# Patient Record
Sex: Male | Born: 1970 | Race: White | Hispanic: No | Marital: Single | Smoking: Heavy tobacco smoker
Health system: Southern US, Community
[De-identification: ages and names within clinical notes are randomized; demographics above are authoritative.]

## PROBLEM LIST (undated history)

## (undated) DIAGNOSIS — R569 Unspecified convulsions: Secondary | ICD-10-CM

## (undated) DIAGNOSIS — G8929 Other chronic pain: Secondary | ICD-10-CM

## (undated) DIAGNOSIS — T40601A Poisoning by unspecified narcotics, accidental (unintentional), initial encounter: Secondary | ICD-10-CM

## (undated) DIAGNOSIS — G510 Bell's palsy: Secondary | ICD-10-CM

## (undated) DIAGNOSIS — L02219 Cutaneous abscess of trunk, unspecified: Secondary | ICD-10-CM

## (undated) DIAGNOSIS — F419 Anxiety disorder, unspecified: Secondary | ICD-10-CM

## (undated) DIAGNOSIS — R404 Transient alteration of awareness: Secondary | ICD-10-CM

## (undated) DIAGNOSIS — F5221 Male erectile disorder: Secondary | ICD-10-CM

## (undated) DIAGNOSIS — M5430 Sciatica, unspecified side: Secondary | ICD-10-CM

## (undated) DIAGNOSIS — M549 Dorsalgia, unspecified: Secondary | ICD-10-CM

## (undated) HISTORY — PX: SPINE SURGERY: SHX786

## (undated) HISTORY — PX: CHOLECYSTECTOMY: SHX55

---

## 2010-07-03 ENCOUNTER — Ambulatory Visit: Payer: Self-pay

## 2010-10-02 ENCOUNTER — Ambulatory Visit: Payer: Self-pay | Admitting: Internal Medicine

## 2011-12-06 IMAGING — CR DG CLAVICLE*R*
1 series · 3 of 3 positions shown · non-contrast
Comparison: none

REASON FOR EXAM: pain
COMMENTS:

PROCEDURE:     MDR - MDR CLAVICLE RIGHT  - October 02, 2010  [DATE]
RESULT:     Three views of the clavicle were obtained. No fracture or other
significant osseous abnormality is noted. The acromioclavicular joint is
normal in appearance.

[Series 1: view not recorded · 0.17mm/px · 3 of 3 slices shown]
[im 1/3]
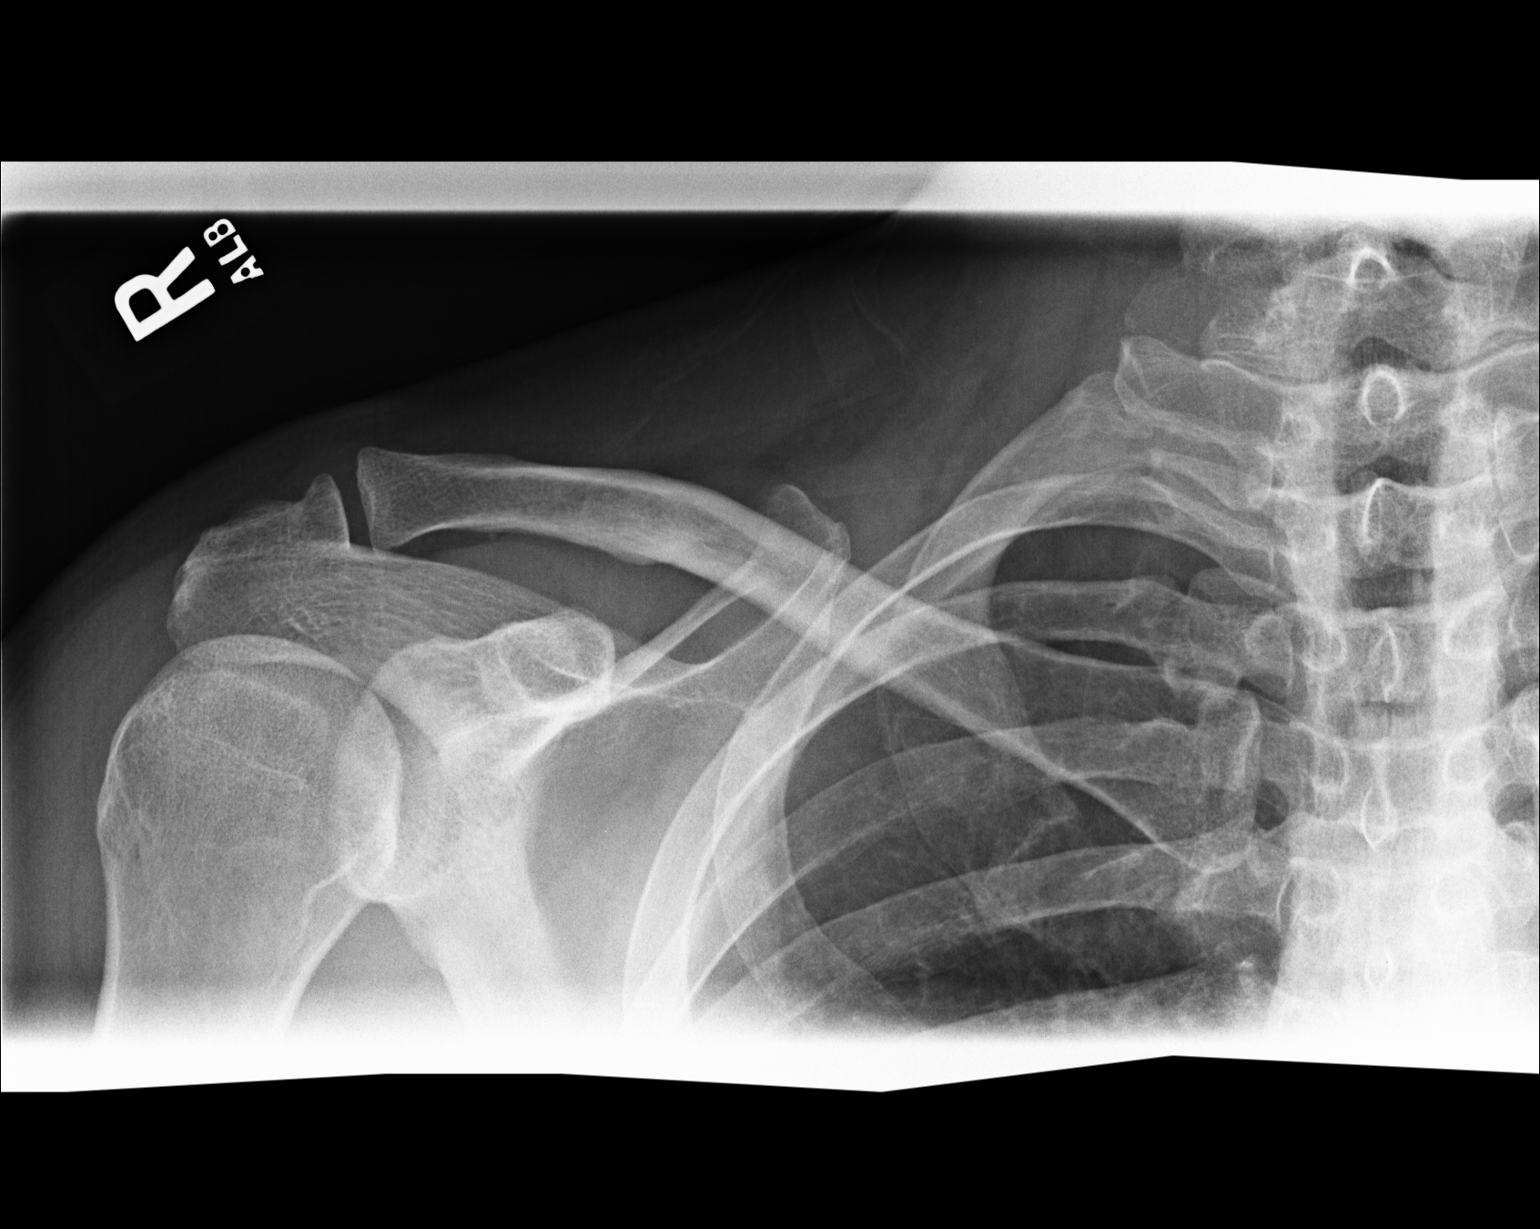
[im 2/3]
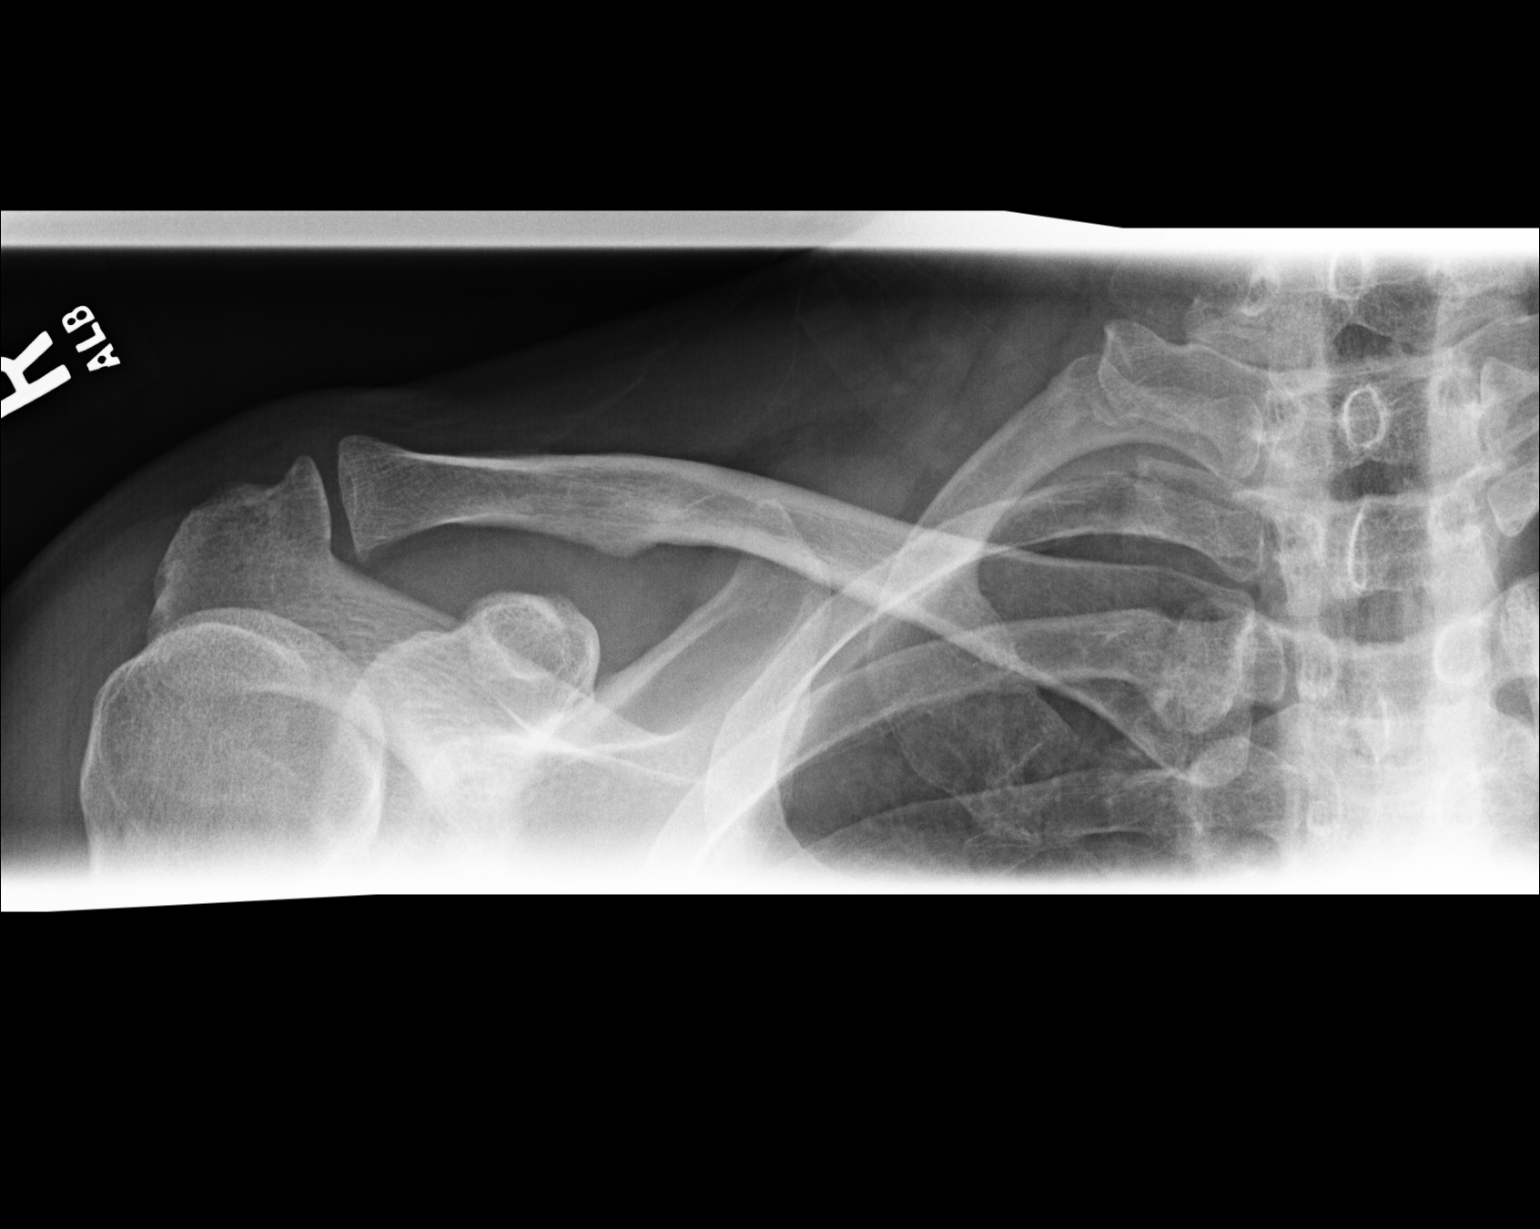
[im 3/3]
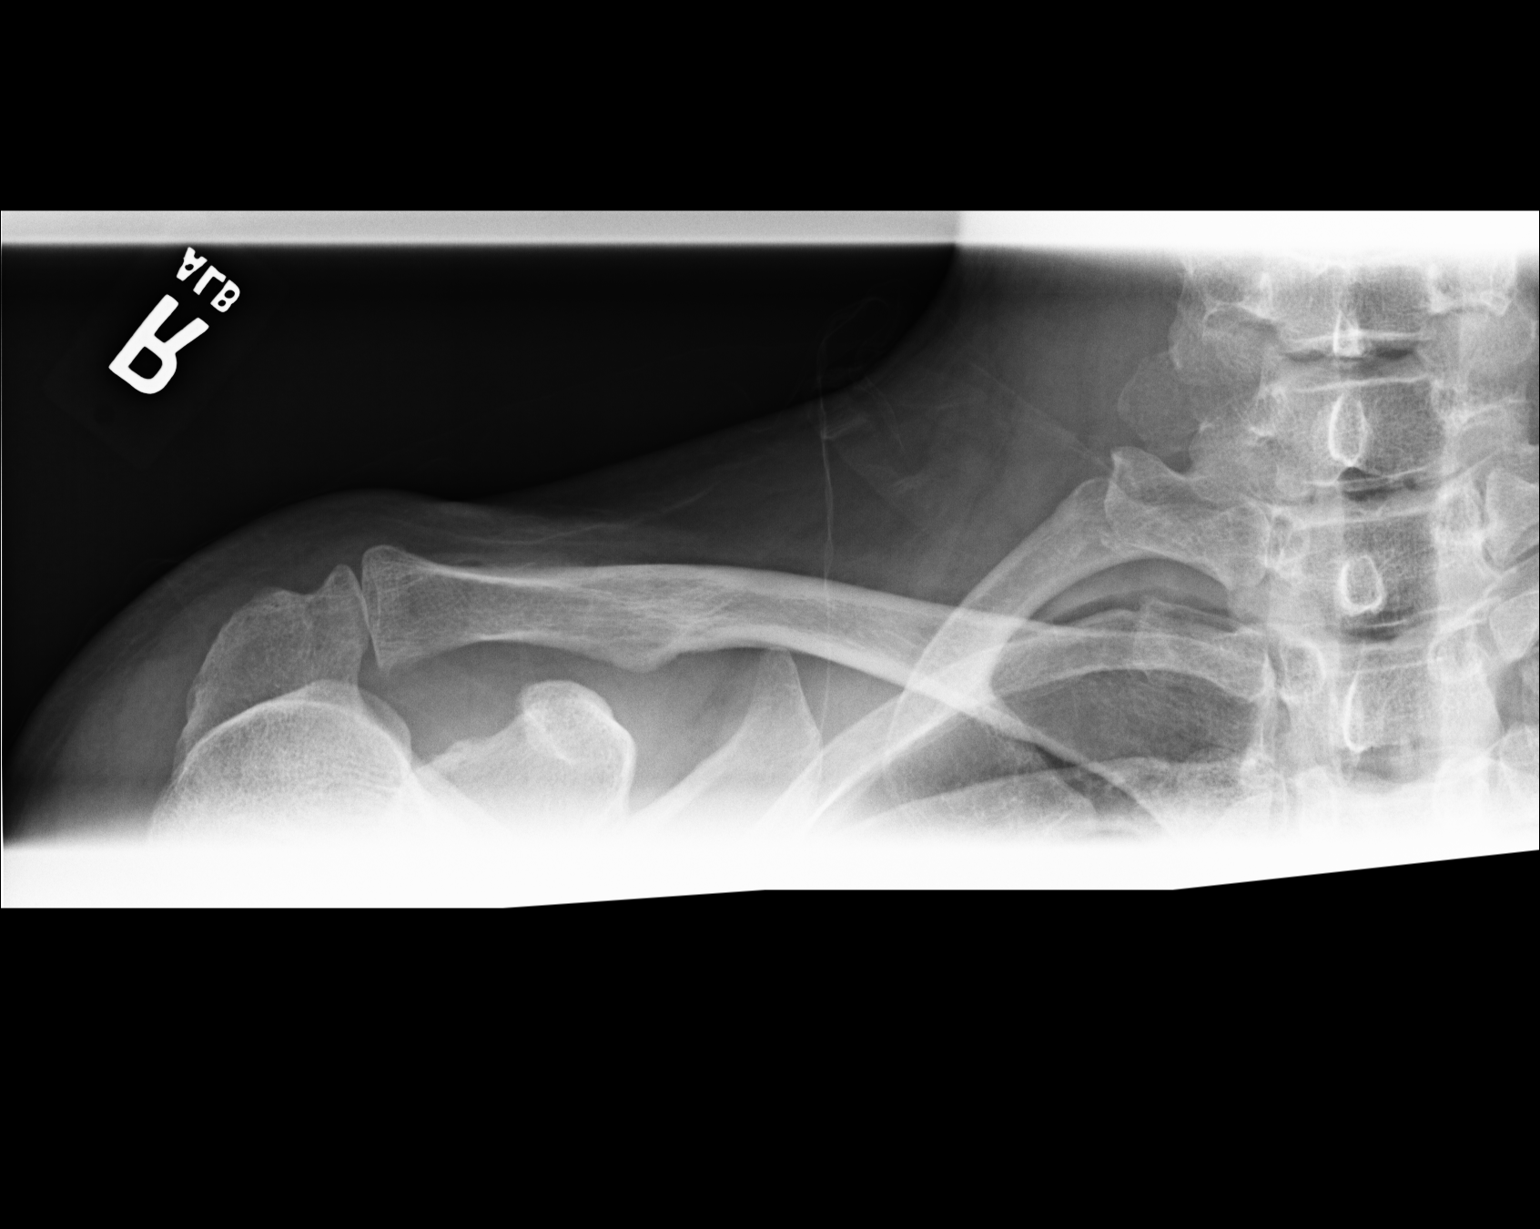

[3 of 3 positions shown; findings below may reference images not displayed]

IMPRESSION: No significant abnormalities are noted.

## 2012-09-09 ENCOUNTER — Emergency Department: Payer: Self-pay | Admitting: Emergency Medicine

## 2012-09-09 LAB — CBC
HCT: 42.7 % (ref 40.0–52.0)
MCH: 32 pg (ref 26.0–34.0)
MCHC: 34.1 g/dL (ref 32.0–36.0)
RBC: 4.54 10*6/uL (ref 4.40–5.90)
RDW: 12.8 % (ref 11.5–14.5)
WBC: 13.3 10*3/uL — ABNORMAL HIGH (ref 3.8–10.6)

## 2012-09-09 LAB — COMPREHENSIVE METABOLIC PANEL
Albumin: 3.7 g/dL (ref 3.4–5.0)
Anion Gap: 11 (ref 7–16)
BUN: 9 mg/dL (ref 7–18)
Bilirubin,Total: 0.3 mg/dL (ref 0.2–1.0)
Chloride: 106 mmol/L (ref 98–107)
Co2: 22 mmol/L (ref 21–32)
Creatinine: 0.84 mg/dL (ref 0.60–1.30)
EGFR (African American): >60
EGFR (Non-African Amer.): >60
Osmolality: 275 (ref 275–301)
Potassium: 4.3 mmol/L (ref 3.5–5.1)

## 2012-09-09 LAB — TROPONIN I: Troponin-I: <0.02 ng/mL

## 2013-04-21 ENCOUNTER — Ambulatory Visit: Payer: Self-pay | Admitting: Family Medicine

## 2013-11-13 IMAGING — CT CT HEAD WITHOUT CONTRAST
1 series · 16 of 30 positions shown, 20 images · non-contrast
Comparison: none

REASON FOR EXAM: seizure
COMMENTS:

PROCEDURE:     CT  - CT HEAD WITHOUT CONTRAST  - September 09, 2012 [DATE]
RESULT:     History: Seizure.
Comparison Study: No prior.

[Series 2: soft tissue · axial · 0.39mm/px · z∈[-121,+29]mm · 16 of 34 slices shown, 20 images]
[im 2/34  brain]
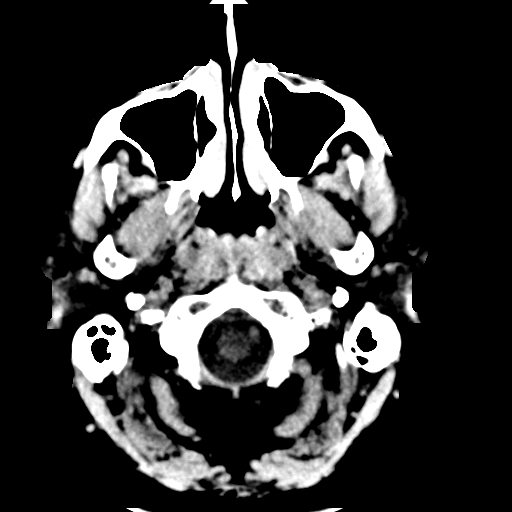
[im 2/34  bone]
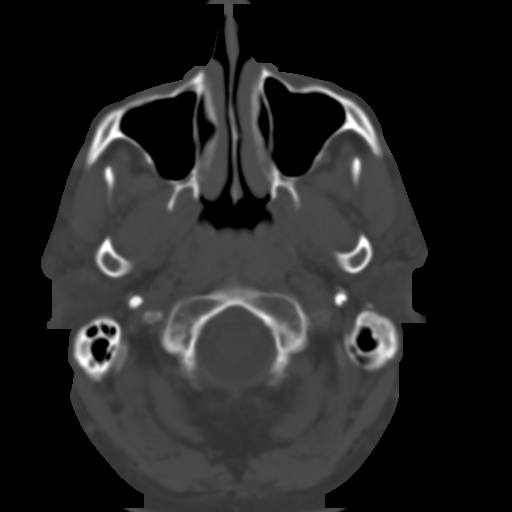
[im 4/34  brain]
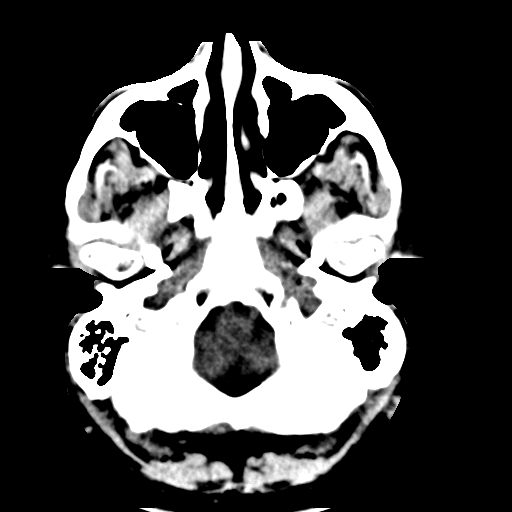
[im 6/34  brain]
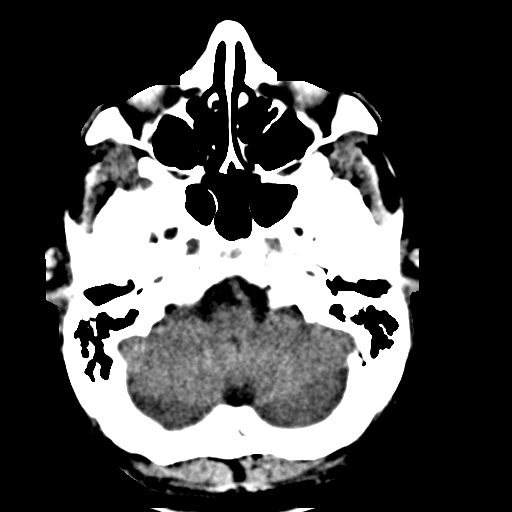
[im 8/34  brain]
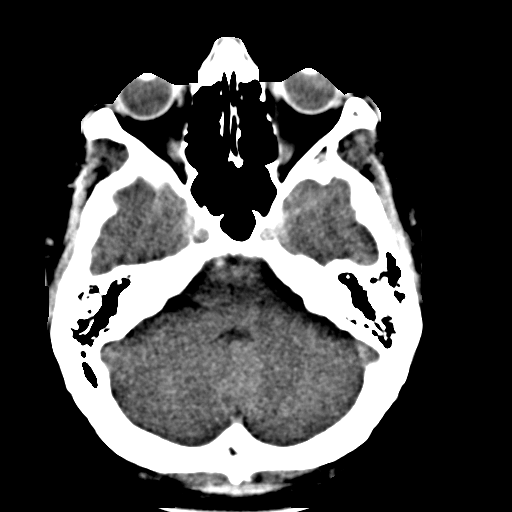
[im 10/34  brain]
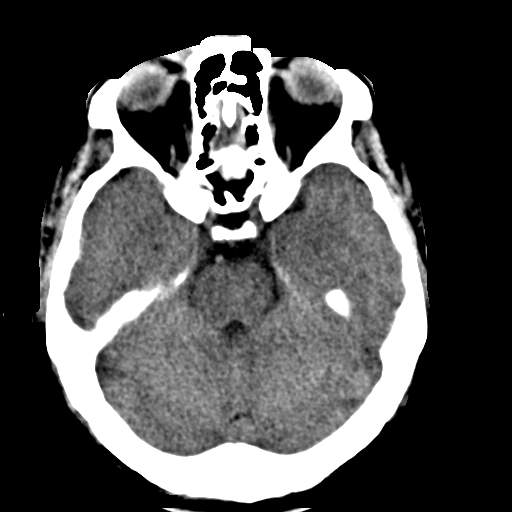
[im 10/34  bone]
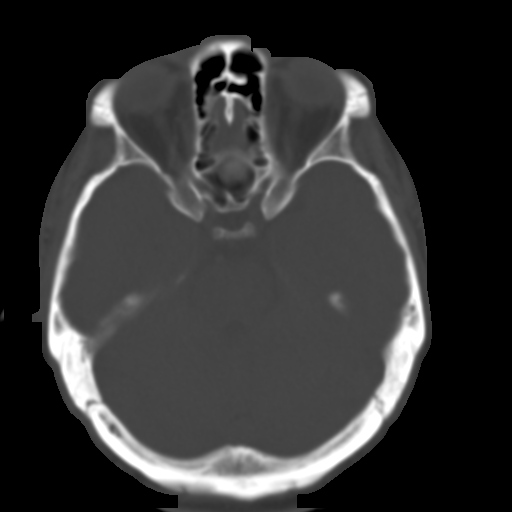
[im 12/34  brain]
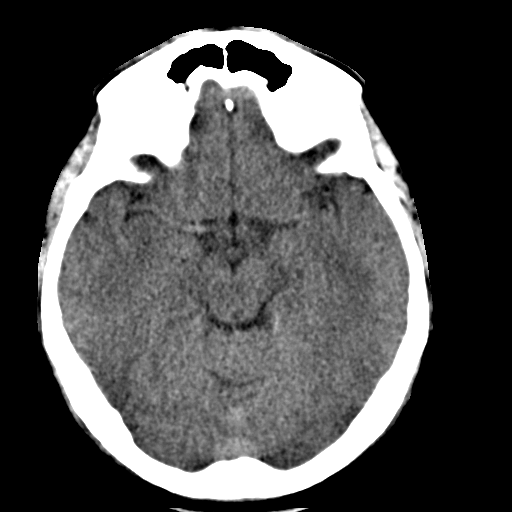
[im 14/34  brain]
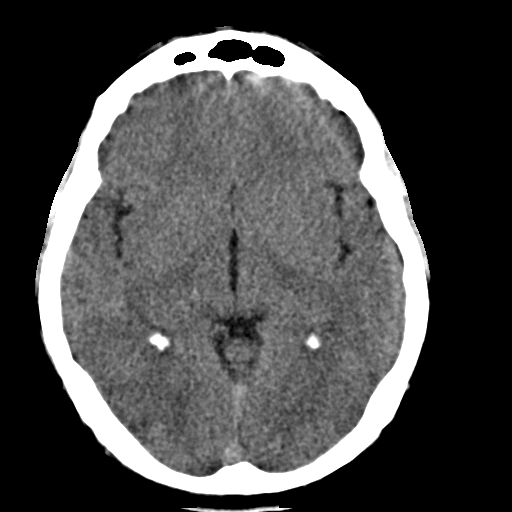
[im 16/34  brain]
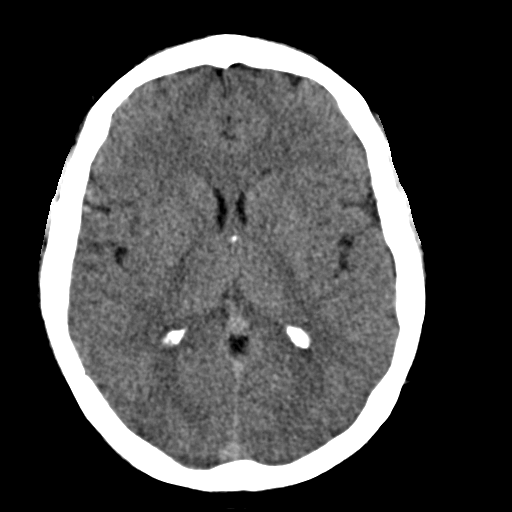
[im 18/34  brain]
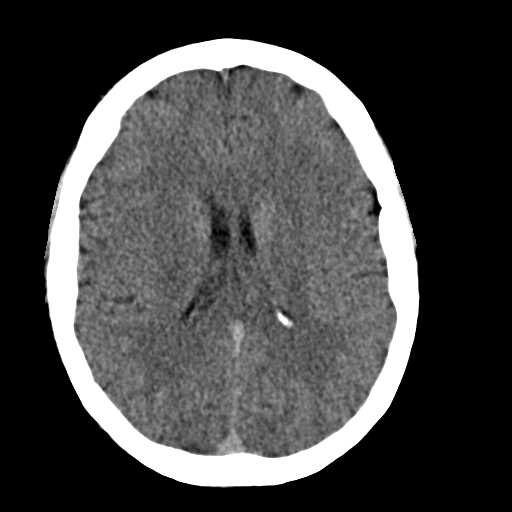
[im 18/34  bone]
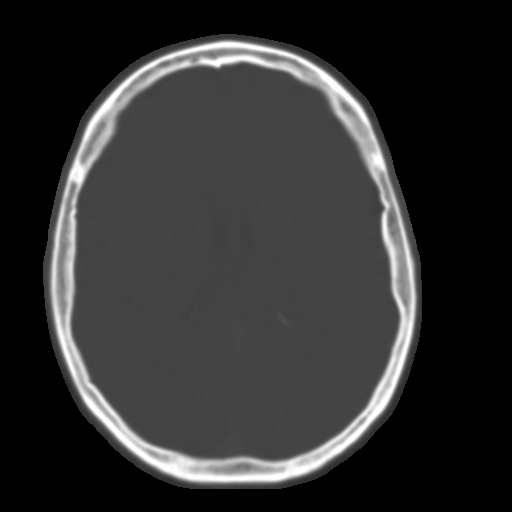
[im 20/34  brain]
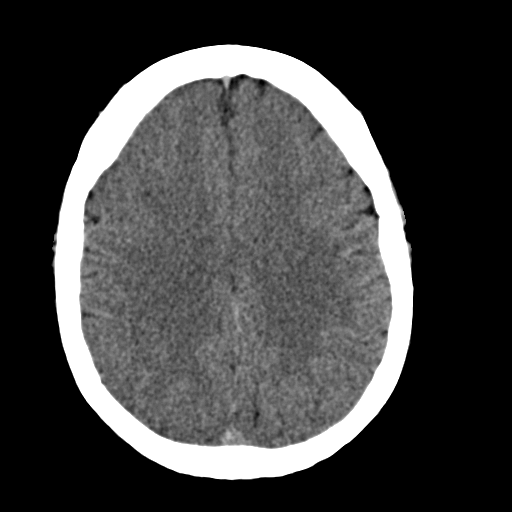
[im 22/34  brain]
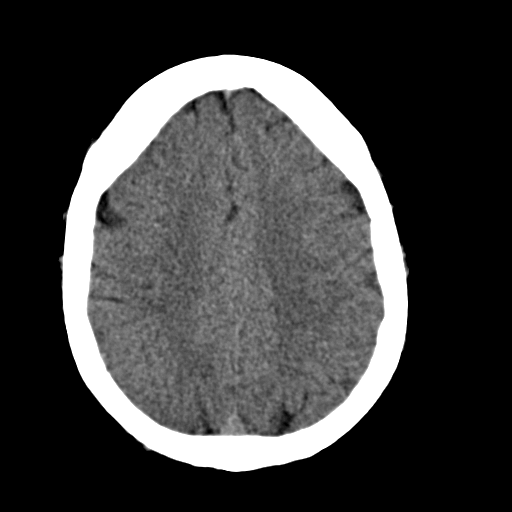
[im 24/34  brain]
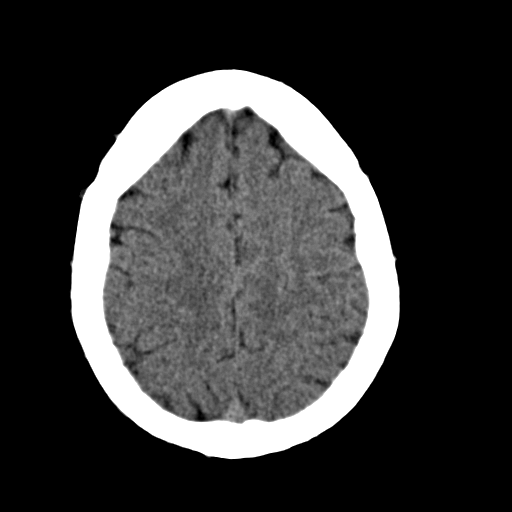
[im 26/34  brain]
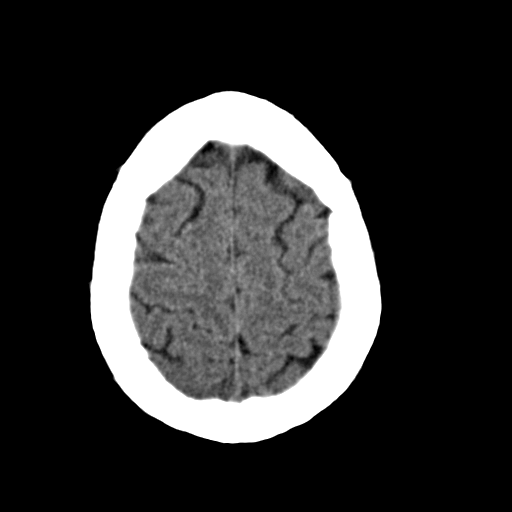
[im 26/34  bone]
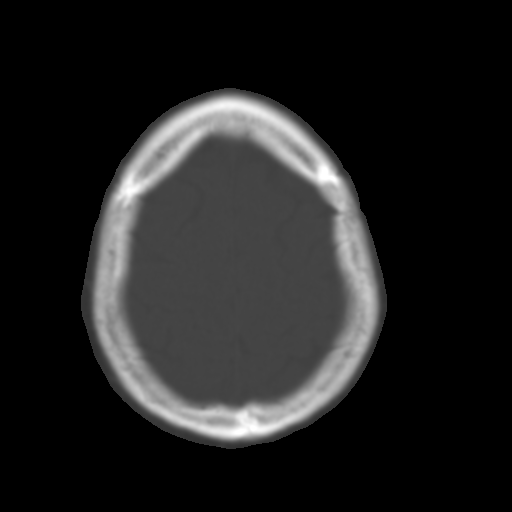
[im 28/34  brain]
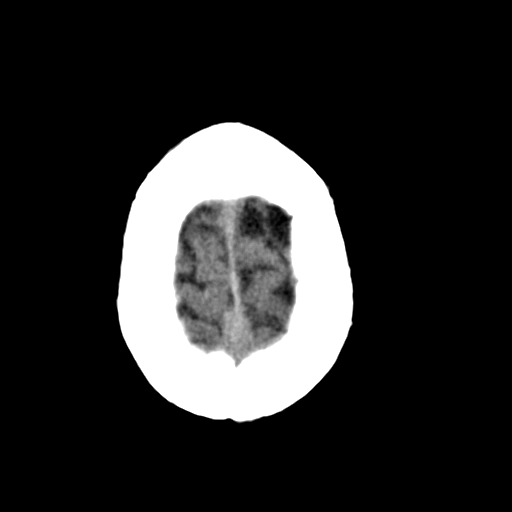
[im 30/34  brain]
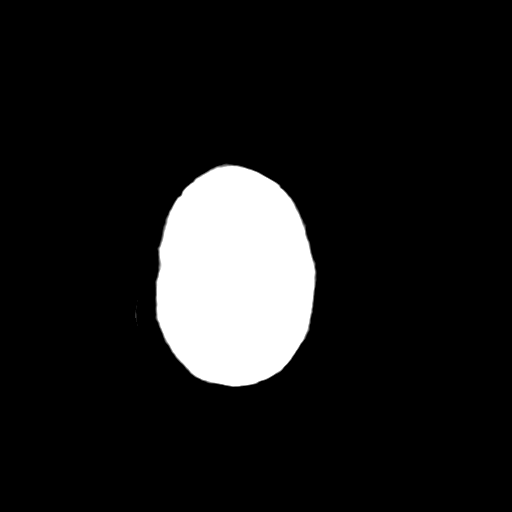
[im 32/34  brain]
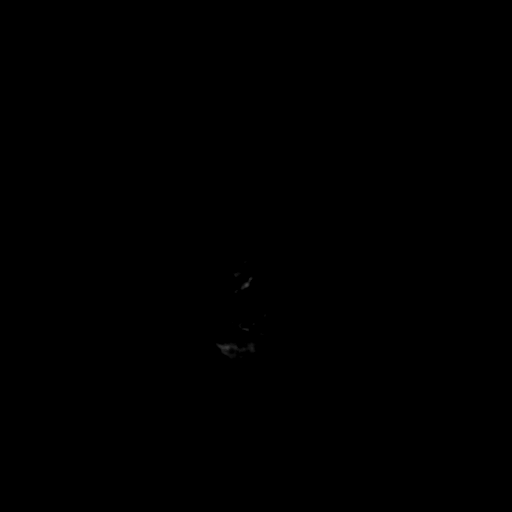

[16 of 30 positions shown; findings below may reference images not displayed]

FINDINGS: Standard nonenhanced CT obtained. No mass. No hydrocephalus. No
hemorrhage no acute bony abnormality.
IMPRESSION: No acute abnormality.

## 2015-10-05 ENCOUNTER — Ambulatory Visit
Admission: EM | Admit: 2015-10-05 | Discharge: 2015-10-05 | Disposition: A | Discharge status: Home / Self Care | Payer: Medicaid Other | Attending: Family Medicine | Admitting: Family Medicine

## 2015-10-05 ENCOUNTER — Encounter: Payer: Self-pay | Admitting: Gynecology

## 2015-10-05 DIAGNOSIS — K047 Periapical abscess without sinus: Secondary | ICD-10-CM

## 2015-10-05 HISTORY — DX: Male erectile disorder: F52.21

## 2015-10-05 HISTORY — DX: Other chronic pain: G89.29

## 2015-10-05 HISTORY — DX: Sciatica, unspecified side: M54.30

## 2015-10-05 HISTORY — DX: Anxiety disorder, unspecified: F41.9

## 2015-10-05 HISTORY — DX: Cutaneous abscess of trunk, unspecified: L02.219

## 2015-10-05 HISTORY — DX: Poisoning by unspecified narcotics, accidental (unintentional), initial encounter: T40.601A

## 2015-10-05 HISTORY — DX: Bell's palsy: G51.0

## 2015-10-05 HISTORY — DX: Transient alteration of awareness: R40.4

## 2015-10-05 HISTORY — DX: Dorsalgia, unspecified: M54.9

## 2015-10-05 HISTORY — DX: Unspecified convulsions: R56.9

## 2015-10-05 MED ORDER — CLINDAMYCIN HCL 150 MG PO CAPS: 150.0000 mg | ORAL_CAPSULE | Freq: Four times a day (QID) | ORAL | Status: AC

## 2015-10-05 NOTE — ED Provider Notes (Signed)
CSN: [id number]     Arrival date & time 10/05/15  0944 History   First MD Initiated Contact with Patient 10/05/15 1100     Chief Complaint  Patient presents with  . Dental Pain   HPI Peter Chang is a pleasant 45 y.o. male who presents with pain and abscesses. He was on the waiting list for appointment at [facility] for extractions over he has not had an appointment yet. He has chronic back pain and is on Suboxone by Dr. Dawna Part at [facility]. He states his pain is in his upper right jaw and at the gumline. He tells me he was eating a piece of fried chicken several weeks ago and shattered his posterior tooth. He has taken acetaminophen at home with little relief of his pain.   Past Medical History  Diagnosis Date  . Transient alteration of awareness   . Erectile disorder, generalized, mild   . Seizures (HCC)   . Anxiety   . Cutaneous abscess of trunk   . Overdose of opiate or related narcotic   . Chronic back pain   . Sciatica neuralgia   . Bell palsy    Past Surgical History  Procedure Laterality Date  . Cholecystectomy    . Spine surgery     No family history on file. Social History  Substance Use Topics  . Smoking status: Heavy Tobacco Smoker -- 1.00 packs/day  . Smokeless tobacco: None  . Alcohol Use: No    Review of Systems  Constitutional: Negative.   HENT: Positive for dental problem. Negative for ear pain, postnasal drip, sinus pressure, sore throat and trouble swallowing.   Eyes: Negative.   Respiratory: Negative.   Cardiovascular: Negative.   Gastrointestinal: Negative.   Genitourinary: Negative.   Musculoskeletal: Negative.   Neurological: Negative.   Hematological: Negative.   Psychiatric/Behavioral: Negative.     Allergies  Acetaminophen; Amitriptyline; and Tricyclic antidepressants  Home Medications   Prior to Admission medications   Medication Sig Start Date End Date Taking? Authorizing Provider  Buprenorphine HCl-Naloxone HCl  (SUBOXONE SL) Place 4 mg under the tongue 3 (three) times daily.    Yes Historical Provider, MD  citalopram (CELEXA) 40 MG tablet Take 40 mg by mouth daily.   Yes Historical Provider, MD  diclofenac sodium (VOLTAREN) 1 % GEL Apply topically 4 (four) times daily.   Yes Historical Provider, MD  gabapentin (NEURONTIN) 300 MG capsule Take 300 mg by mouth 3 (three) times daily.   Yes Historical Provider, MD  levETIRAcetam (KEPPRA) 750 MG tablet Take 750 mg by mouth 2 (two) times daily.   Yes Historical Provider, MD  OLANZapine (ZYPREXA) 20 MG tablet Take 20 mg by mouth at bedtime.   Yes Historical Provider, MD  omeprazole (PRILOSEC) 40 MG capsule Take 40 mg by mouth daily.   Yes Historical Provider, MD  pregabalin (LYRICA) 150 MG capsule Take 150 mg by mouth 2 (two) times daily.   Yes Historical Provider, MD  tiZANidine (ZANAFLEX) 4 MG capsule Take 4 mg by mouth 3 (three) times daily.   Yes Historical Provider, MD  topiramate (TOPAMAX) 25 MG tablet Take 25 mg by mouth 2 (two) times daily.   Yes Historical Provider, MD  traZODone (DESYREL) 50 MG tablet Take 50 mg by mouth at bedtime.   Yes Historical Provider, MD   Meds Ordered and Administered this Visit  Medications - No data to display  BP 143/86 mmHg  Pulse 94  Temp(Src) 97.3 F (36.3 C) (  Tympanic)  Resp 16  Ht  (1.905 m)  Wt 241 lb (109.317 kg)  BMI 30.12 kg/m2  SpO2 97% No data found.   Physical Exam  Constitutional: He is oriented to person, place, and time. He appears well-developed and well-nourished. No distress.  HENT:  Head: Normocephalic and atraumatic.  Right Ear: Hearing normal.  Left Ear: Hearing normal.  Nose: No mucosal edema. Right sinus exhibits no maxillary sinus tenderness and no frontal sinus tenderness. Left sinus exhibits no maxillary sinus tenderness and no frontal sinus tenderness.  Mouth/Throat: Uvula is midline, oropharynx is clear and moist and mucous membranes are normal.    Eyes: Conjunctivae are  normal. No scleral icterus.  Neck: Normal range of motion.  Cardiovascular: Normal rate and regular rhythm.   Pulmonary/Chest: Effort normal and breath sounds normal. No respiratory distress.  Abdominal: Soft. Bowel sounds are normal. He exhibits no distension.  Musculoskeletal: Normal range of motion. He exhibits no edema or tenderness.  Neurological: He is alert and oriented to person, place, and time. No cranial nerve deficit.  Skin: Skin is warm and dry. No rash noted. No erythema.  Psychiatric: His behavior is normal. Judgment normal.  Nursing note and vitals reviewed.   ED Course  Procedures n/a  MDM   1. Dental abscess    Plan: Diagnosis reviewed with patient Rx as per orders;  benefits, risks, potential side effects reviewed  Recommend supportive treatment with rest, ice or heat PRN Appt ASAP [facility] Patient is on suboxone for pain management     Joselyn Arrow, NP 10/05/15 1124

## 2015-10-05 NOTE — ED Notes (Signed)
Patient c/o right upper jaw tooth abscess x couple months. Per patient waiting to get into the [facility] dental school for treatment.

## 2015-10-05 NOTE — Discharge Instructions (Signed)

## 2017-09-07 DEATH — deceased
